# Patient Record
Sex: Female | Born: 2000 | Race: White | Hispanic: No | Marital: Single | Smoking: Never smoker
Health system: Southern US, Community
[De-identification: ages and names within clinical notes are randomized; demographics above are authoritative.]

---
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
## 2019-06-15 ENCOUNTER — Other Ambulatory Visit: Payer: Self-pay

## 2019-06-15 DIAGNOSIS — Z20822 Contact with and (suspected) exposure to covid-19: Secondary | ICD-10-CM

## 2019-06-16 LAB — NOVEL CORONAVIRUS, NAA: SARS-CoV-2, NAA: NOT DETECTED

## 2019-10-27 IMAGING — CR DG CHEST 2V
2 series · 2 of 2 positions shown · non-contrast
Comparison: Chest x-ray dated November 14, 2008.

CLINICAL DATA: Persistent cough for the past 2 months.

EXAM:
CHEST - 2 VIEW

[w chest pa 8-[id] (15-22cm)]
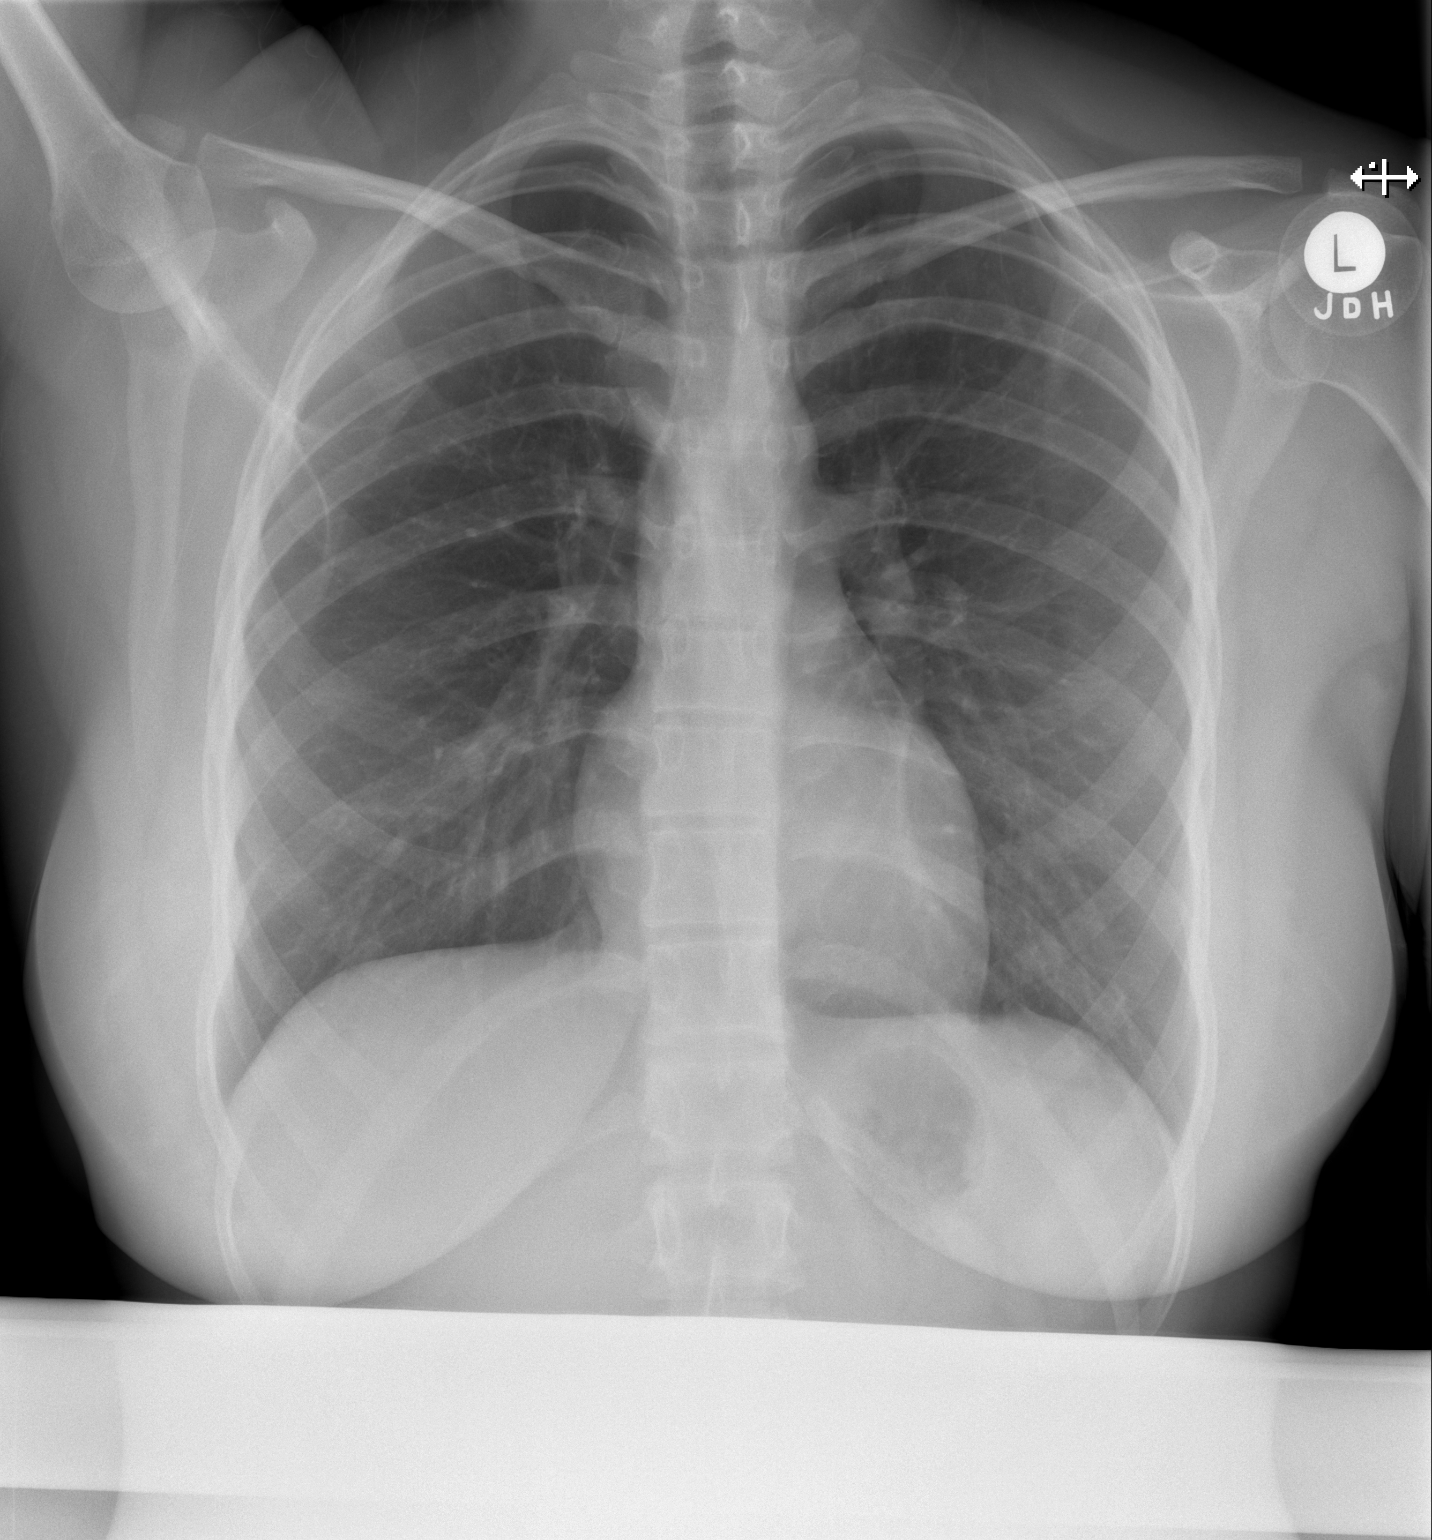

[w chest lat 8-[id] (21-28cm)]
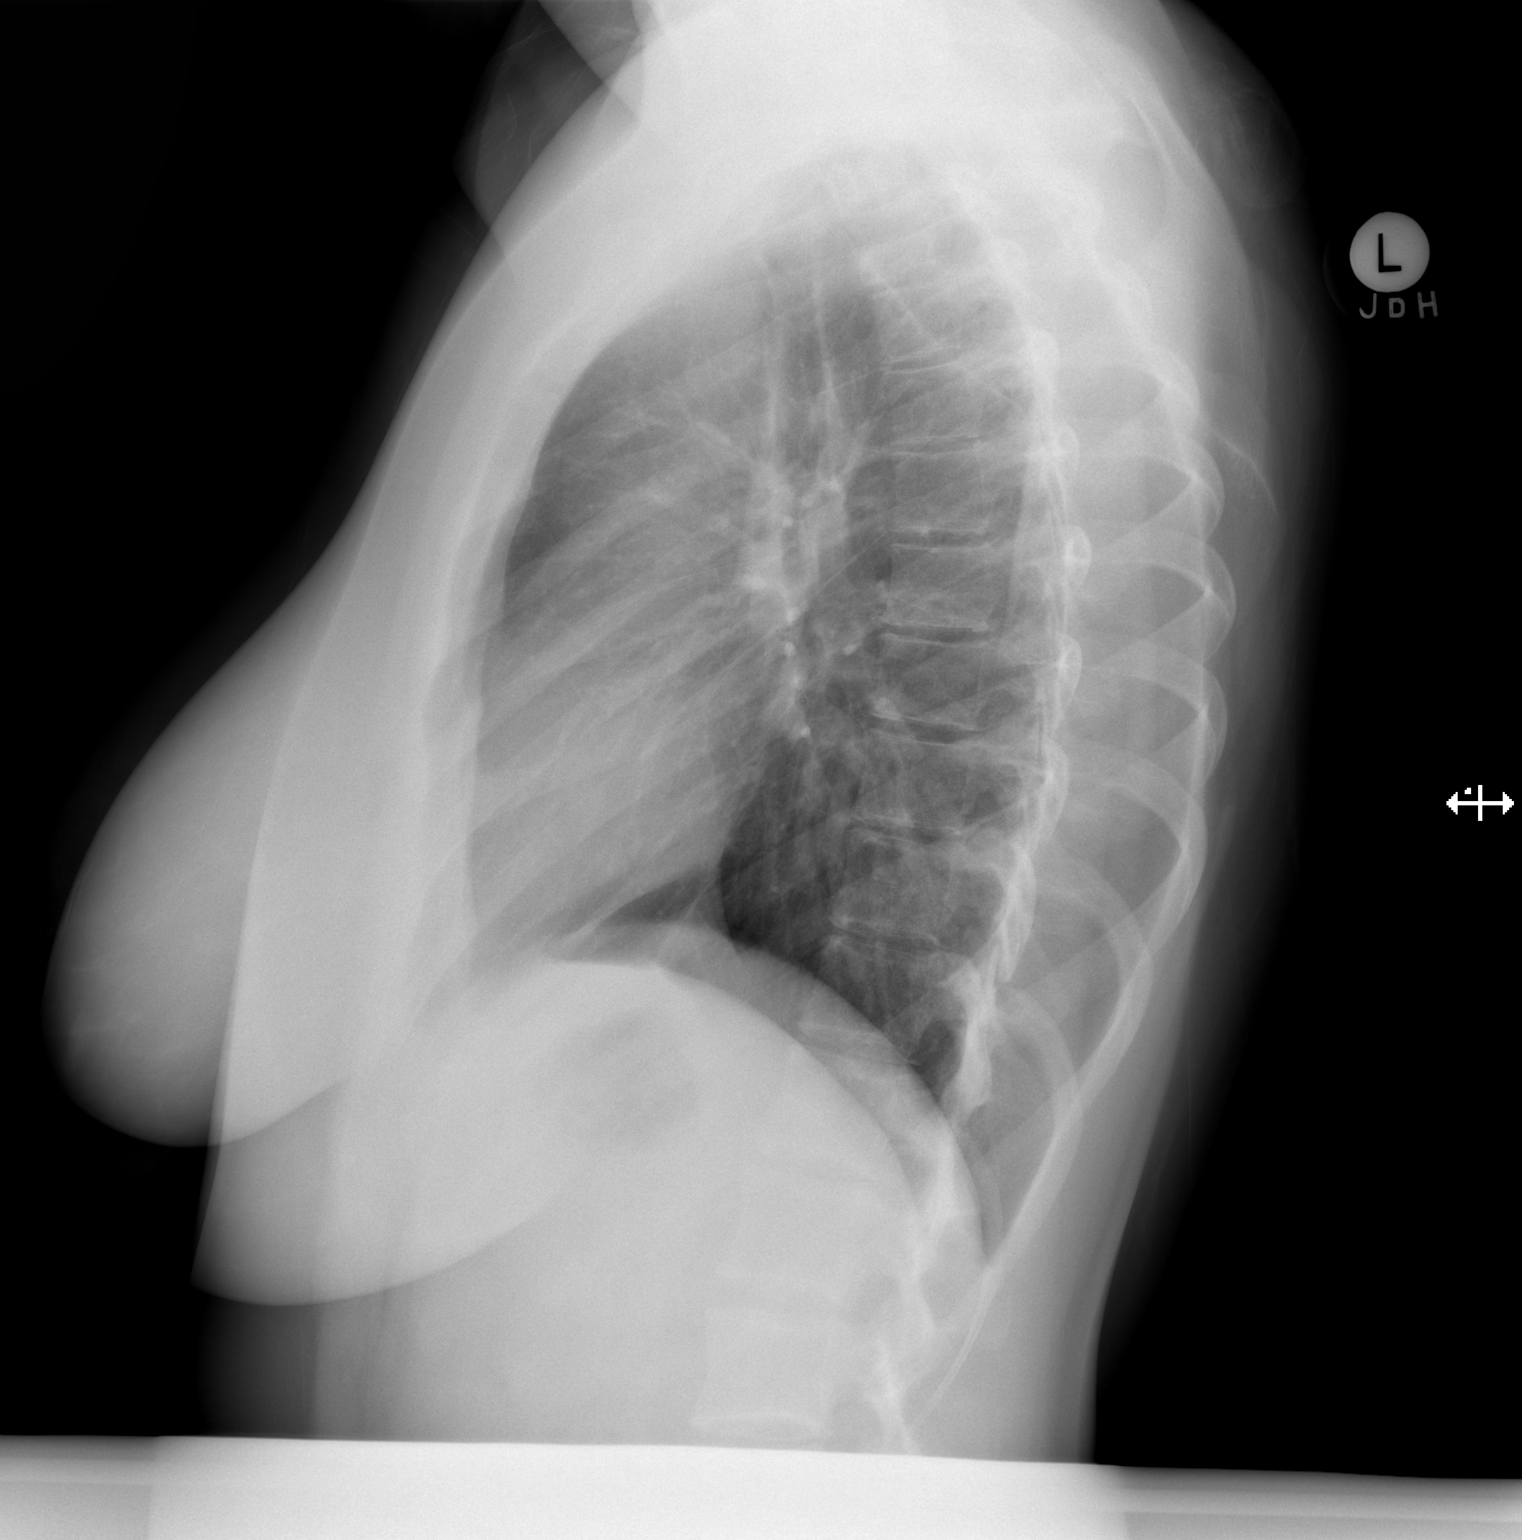

[2 of 2 positions shown; findings below may reference images not displayed]

FINDINGS: The heart size and mediastinal contours are within normal limits.
Both lungs are clear. The visualized skeletal structures are
unremarkable.
IMPRESSION: Normal chest x-ray.

## 2019-11-09 ENCOUNTER — Ambulatory Visit: Payer: PRIVATE HEALTH INSURANCE | Attending: Internal Medicine

## 2019-11-09 DIAGNOSIS — Z20822 Contact with and (suspected) exposure to covid-19: Secondary | ICD-10-CM

## 2019-11-11 LAB — NOVEL CORONAVIRUS, NAA: SARS-CoV-2, NAA: NOT DETECTED

## 2023-08-27 ENCOUNTER — Other Ambulatory Visit (HOSPITAL_BASED_OUTPATIENT_CLINIC_OR_DEPARTMENT_OTHER): Payer: Self-pay

## 2023-08-27 MED ORDER — INFLUENZA VIRUS VACC SPLIT PF (FLUZONE) 0.5 ML IM SUSY
0.5000 mL | PREFILLED_SYRINGE | Freq: Once | INTRAMUSCULAR | 0 refills | Status: AC
  Filled 2023-08-27: qty 0.5, 1d supply, fill #0
